# Patient Record
Sex: Female | Born: 1945 | Race: White | Hispanic: No | Marital: Married | Smoking: Former smoker
Health system: Southern US, Community
[De-identification: ages and names within clinical notes are randomized; demographics above are authoritative.]

## PROBLEM LIST (undated history)

## (undated) DIAGNOSIS — I10 Essential (primary) hypertension: Secondary | ICD-10-CM

## (undated) DIAGNOSIS — J302 Other seasonal allergic rhinitis: Secondary | ICD-10-CM

## (undated) DIAGNOSIS — Z853 Personal history of malignant neoplasm of breast: Secondary | ICD-10-CM

## (undated) HISTORY — PX: APPENDECTOMY: SHX54

## (undated) HISTORY — PX: TUBAL LIGATION: SHX77

## (undated) HISTORY — PX: BREAST SURGERY: SHX581

## (undated) HISTORY — PX: ABDOMINAL HYSTERECTOMY: SHX81

---

## 2007-05-25 ENCOUNTER — Ambulatory Visit: Payer: Self-pay | Admitting: Family Medicine

## 2007-09-03 ENCOUNTER — Ambulatory Visit: Payer: Self-pay | Admitting: Internal Medicine

## 2008-07-09 ENCOUNTER — Ambulatory Visit: Payer: Self-pay | Admitting: Internal Medicine

## 2014-10-06 ENCOUNTER — Ambulatory Visit: Payer: Self-pay | Admitting: Ophthalmology

## 2014-10-27 ENCOUNTER — Ambulatory Visit: Payer: Self-pay | Admitting: Ophthalmology

## 2019-08-10 ENCOUNTER — Encounter: Payer: Self-pay | Admitting: Emergency Medicine

## 2019-08-10 ENCOUNTER — Ambulatory Visit
Admission: EM | Admit: 2019-08-10 | Discharge: 2019-08-10 | Disposition: A | Discharge status: Home / Self Care | Payer: Medicare Other | Attending: Family Medicine | Admitting: Family Medicine

## 2019-08-10 ENCOUNTER — Ambulatory Visit (INDEPENDENT_AMBULATORY_CARE_PROVIDER_SITE_OTHER): Payer: Medicare Other

## 2019-08-10 ENCOUNTER — Other Ambulatory Visit: Payer: Self-pay

## 2019-08-10 DIAGNOSIS — S63616A Unspecified sprain of right little finger, initial encounter: Secondary | ICD-10-CM | POA: Diagnosis not present

## 2019-08-10 DIAGNOSIS — S60221A Contusion of right hand, initial encounter: Secondary | ICD-10-CM

## 2019-08-10 DIAGNOSIS — W01198A Fall on same level from slipping, tripping and stumbling with subsequent striking against other object, initial encounter: Secondary | ICD-10-CM | POA: Diagnosis not present

## 2019-08-10 DIAGNOSIS — M79641 Pain in right hand: Secondary | ICD-10-CM | POA: Diagnosis not present

## 2019-08-10 HISTORY — DX: Other seasonal allergic rhinitis: J30.2

## 2019-08-10 HISTORY — DX: Personal history of malignant neoplasm of breast: Z85.3

## 2019-08-10 HISTORY — DX: Essential (primary) hypertension: I10

## 2019-08-10 NOTE — ED Triage Notes (Signed)
Patient in today c/o right hand and pinky finger pain. Patient states she twisted her ankle this morning and tried to catch herself and hurt her right hand/finger.

## 2019-08-10 NOTE — Discharge Instructions (Addendum)
Rest, ice, elevation, over the counter analgesics  

## 2019-08-10 NOTE — ED Provider Notes (Signed)
[facility]    CSN: [id number] Arrival date & time: 08/10/19  1443      History   Chief Complaint Chief Complaint  Patient presents with  . Hand Injury    right DOI 08/10/19    HPI Melanie Wells is a 73 y.o. female.   73 yo female with a c/o right hand pain after injuring it today. Patient states she hit her hand as she was trying to catch herself as she fell. States pain is mainly over the 5th finger but her whole hand hurts.    Hand Injury   Past Medical History:  Diagnosis Date  . History of breast cancer   . Hypertension   . Seasonal allergies     There are no problems to display for this patient.   Past Surgical History:  Procedure Laterality Date  . ABDOMINAL HYSTERECTOMY    . APPENDECTOMY    . BREAST SURGERY    . TUBAL LIGATION      OB History   No obstetric history on file.      Home Medications    Prior to Admission medications   Medication Sig Start Date End Date Taking? Authorizing Provider  Cholecalciferol 25 MCG (1000 UT) tablet Take by mouth.   Yes [provider]  fexofenadine (ALLEGRA) 180 MG tablet Take 180 mg by mouth daily as needed for allergies or rhinitis.   Yes [provider]  fluticasone (FLONASE) 50 MCG/ACT nasal spray Place into the nose. 06/14/18  Yes [provider]  hydrochlorothiazide (HYDRODIURIL) 12.5 MG tablet Take 12.5 mg by mouth daily. 06/04/19  Yes [provider]  Omega-3 Fatty Acids (FISH OIL PO) Take by mouth.   Yes [provider]    Family History Family History  Problem Relation Age of Onset  . Other Mother        suicide  . Heart attack Father     Social History Social History   Tobacco Use  . Smoking status: Former Smoker    Quit date: 2007    Years since quitting: 13.9  . Smokeless tobacco: Never Used  Substance Use Topics  . Alcohol use: Yes    Comment: 8-10 drinks per week in the evening  . Drug use: Never     Allergies     Other   Review of Systems Review of Systems   Physical Exam Triage Vital Signs ED Triage Vitals  Enc Vitals Group     BP 08/10/19 1500 138/63     Pulse Rate 08/10/19 1500 95     Resp 08/10/19 1500 16     Temp 08/10/19 1500 98.1 F (36.7 C)     Temp Source 08/10/19 1500 Oral     SpO2 08/10/19 1500 99 %     Weight 08/10/19 1501 186 lb (84.4 kg)     Height 08/10/19 1501 5\' 3"  (1.6 m)     Head Circumference --      Peak Flow --      Pain Score 08/10/19 1501 9     Pain Loc --      Pain Edu? --      Excl. in [place]? --    No data found.  Updated Vital Signs BP 138/63 (BP Location: Left Arm)   Pulse 95   Temp 98.1 F (36.7 C) (Oral)   Resp 16   Ht 5\' 3"  (1.6 m)   Wt 84.4 kg   SpO2 99%   BMI 32.95 kg/m  Visual Acuity Right Eye Distance:   Left Eye Distance:   Bilateral Distance:    Right Eye Near:   Left Eye Near:    Bilateral Near:     Physical Exam Vitals and nursing note reviewed.  Constitutional:      General: She is not in acute distress.    Appearance: She is not ill-appearing or toxic-appearing.  Musculoskeletal:     Right hand: Swelling (5th finger) and bony tenderness (5th finger) present. No deformity, lacerations or tenderness. Normal range of motion. Normal strength. Normal sensation. There is no disruption of two-point discrimination. Normal capillary refill. Normal pulse.  Neurological:     Mental Status: She is alert.      UC Treatments / Results  Labs (all labs ordered are listed, but only abnormal results are displayed) Labs Reviewed - No data to display  EKG   Radiology DG Hand Complete Right  Result Date: 08/10/2019 CLINICAL DATA:  Fall.  Fifth metacarpal and small finger pain. EXAM: RIGHT HAND - COMPLETE 3+ VIEW COMPARISON:  None. FINDINGS: No acute fracture or dislocation. 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 Mild osteoarthritis of the thumb IP joint and third DIP joint. Osteopenia. Soft tissues are unremarkable.  IMPRESSION: No acute osseous abnormality. Electronically Signed   By: Titus Dubin M.D.   On: 08/10/2019 15:23    Procedures Procedures (including critical care time)  Medications Ordered in UC Medications - No data to display  Initial Impression / Assessment and Plan / UC Course  I have reviewed the triage vital signs and the nursing notes.  Pertinent labs & imaging results that were available during my care of the patient were reviewed by me and considered in my medical decision making (see chart for details).      Final Clinical Impressions(s) / UC Diagnoses   Final diagnoses:  Sprain of right little finger, unspecified site of digit, initial encounter  Contusion of right hand, initial encounter     Discharge Instructions     Rest, ice, elevation, over the counter analgesics    ED Prescriptions    None      1. x-ray results and diagnosis reviewed with patient 2. Recommend supportive treatment as above 3. Follow-up prn if symptoms worsen or don't improve  PDMP not reviewed this encounter.   Norval Gable, MD 08/10/19 [phone]

## 2019-10-09 ENCOUNTER — Other Ambulatory Visit (HOSPITAL_COMMUNITY): Payer: Self-pay | Admitting: Family Medicine

## 2019-10-09 ENCOUNTER — Other Ambulatory Visit: Payer: Self-pay | Admitting: Family Medicine

## 2019-10-09 ENCOUNTER — Other Ambulatory Visit: Payer: Self-pay

## 2019-10-09 ENCOUNTER — Ambulatory Visit
Admission: RE | Admit: 2019-10-09 | Discharge: 2019-10-09 | Disposition: A | Discharge status: Home / Self Care | Payer: Medicare Other | Source: Ambulatory Visit | Attending: Family Medicine | Admitting: Family Medicine

## 2019-10-09 DIAGNOSIS — R600 Localized edema: Secondary | ICD-10-CM | POA: Insufficient documentation

## 2020-10-20 IMAGING — US US EXTREM  UP VENOUS*L*
1 series · 13 of 24 positions shown · non-contrast
Comparison: None.

CLINICAL DATA: Left arm swelling and redness



[Series 1: us extrem up venous*left* · 0.08mm/px · 13 of 32 slices shown]
[im 1/32]
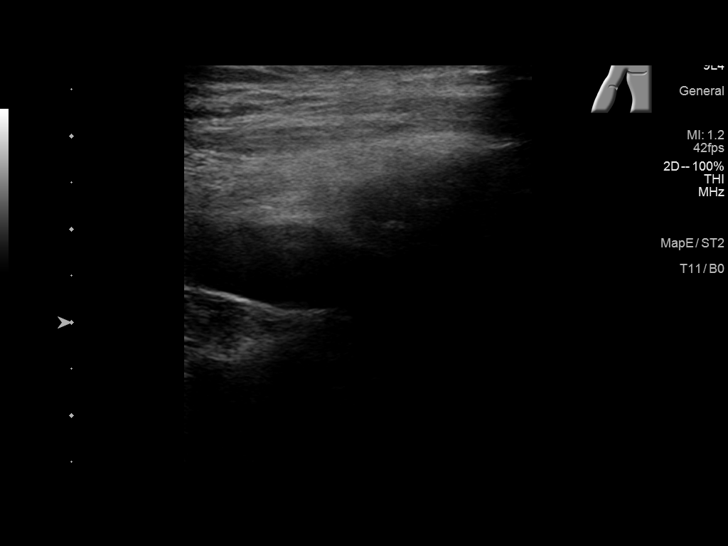
[im 3/32]
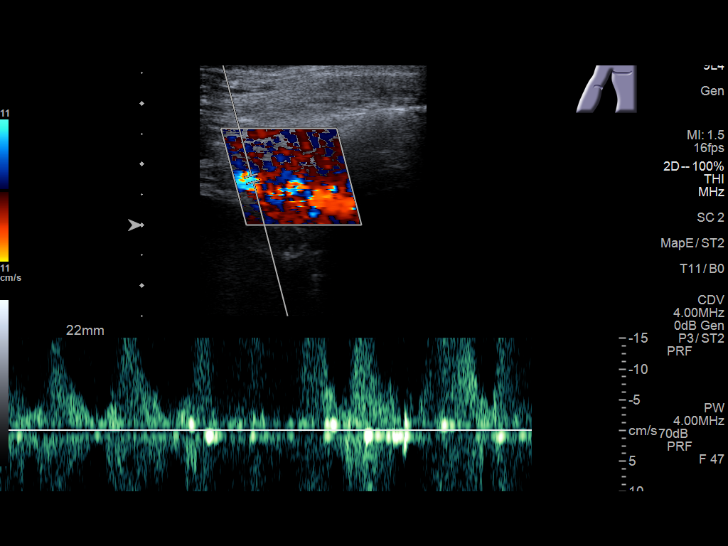
[im 6/32]
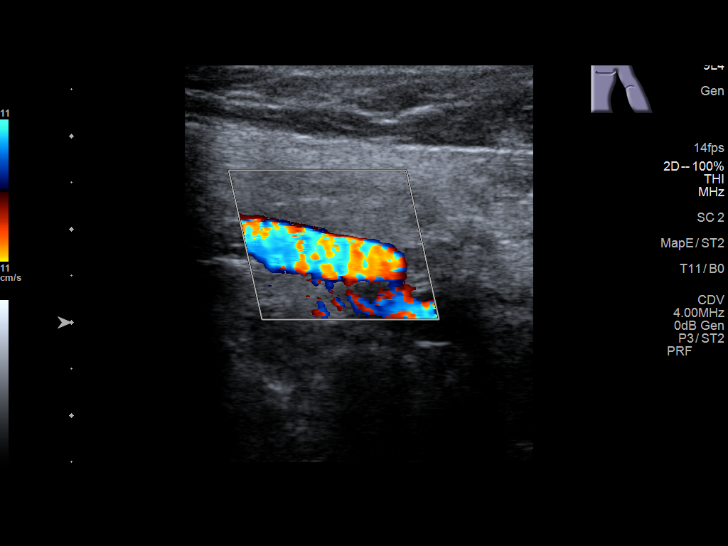
[im 9/32]
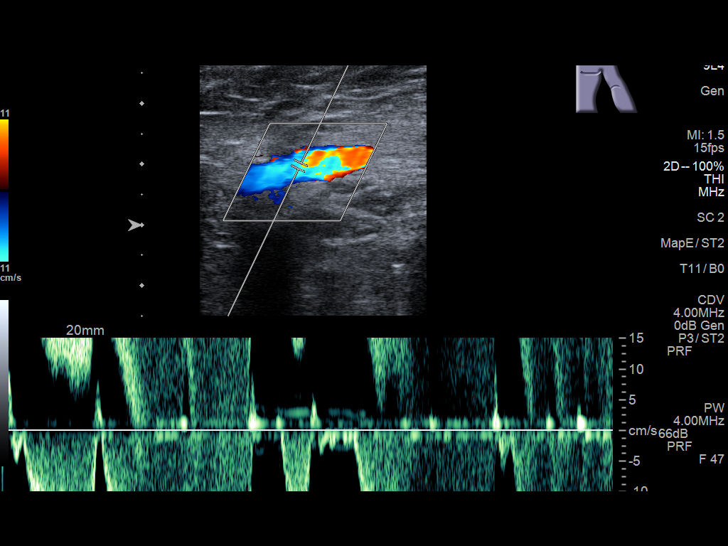
[im 11/32]
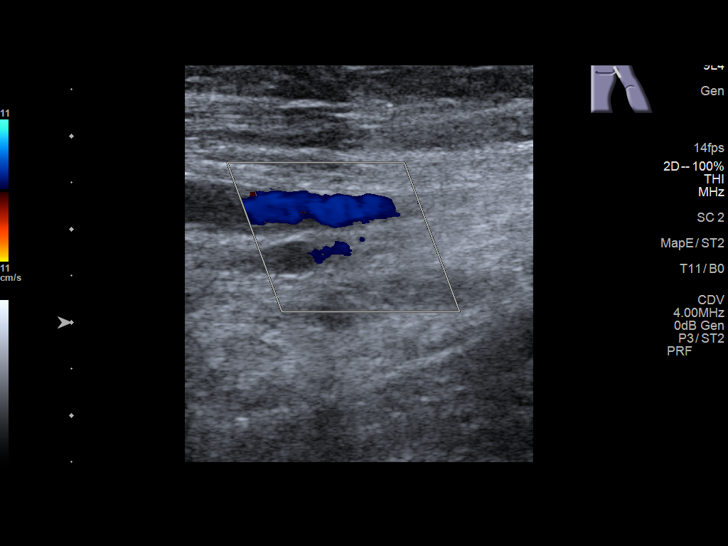
[im 14/32]
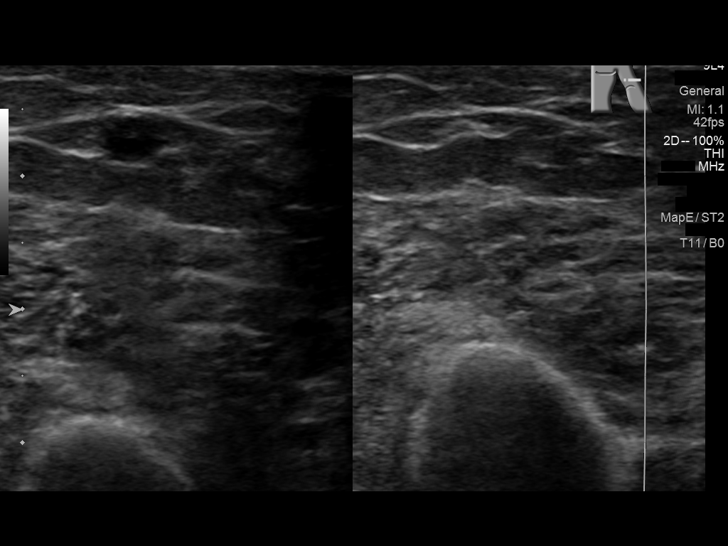
[im 17/32]
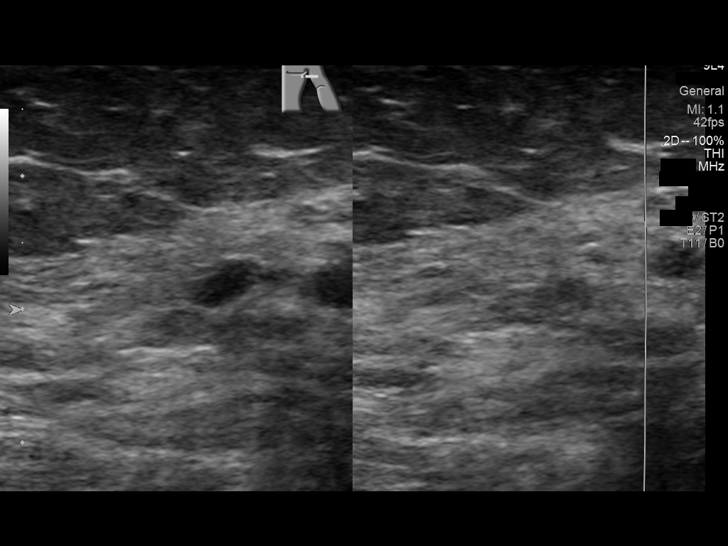
[im 18/32]
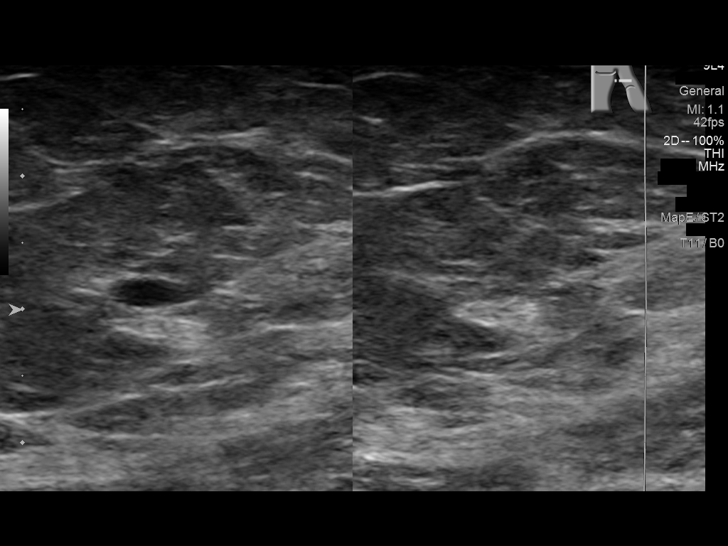
[im 21/32]
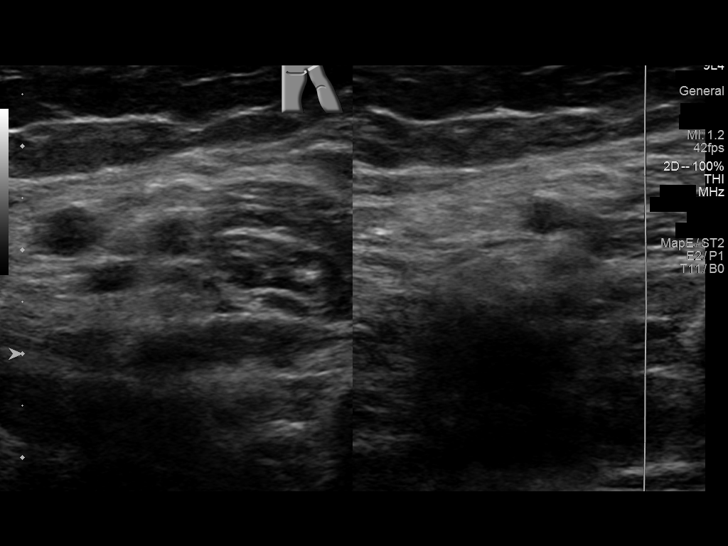
[im 23/32]
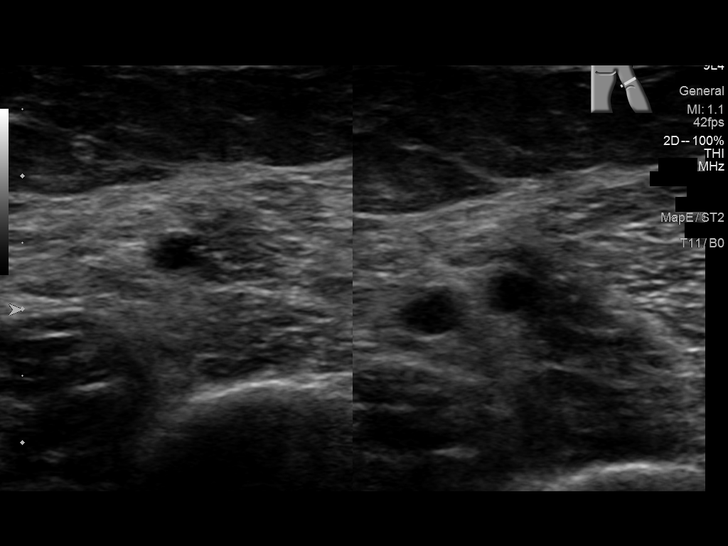
[im 26/32]
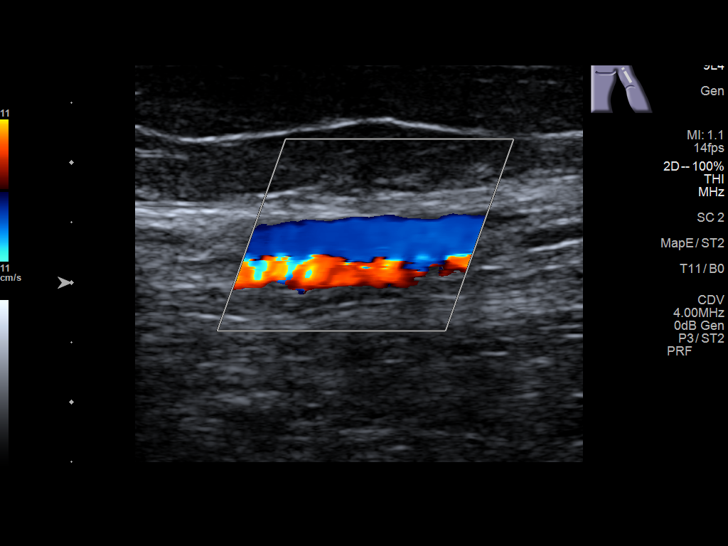
[im 29/32]
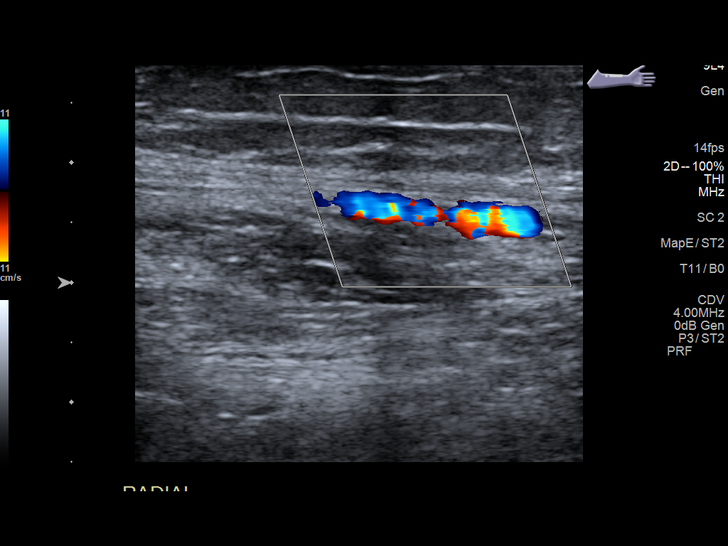
[im 32/32]
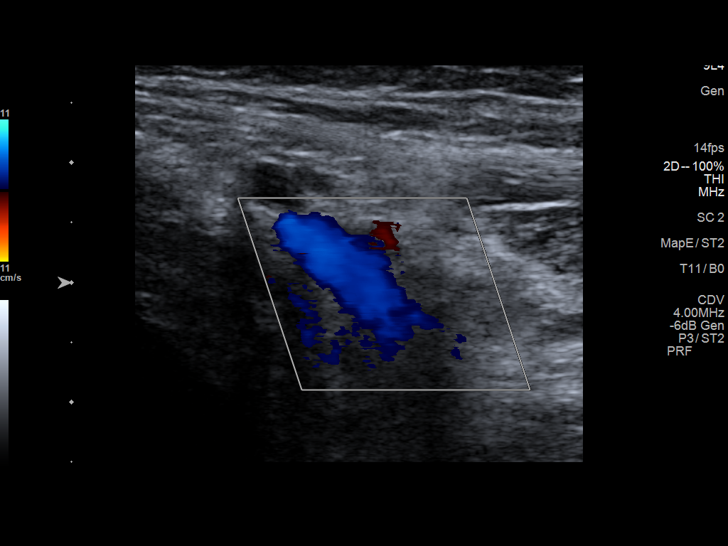

[13 of 24 positions shown; findings below may reference images not displayed]

FINDINGS: Contralateral Subclavian Vein: Respiratory phasicity is normal and
symmetric with the symptomatic side. No evidence of thrombus. Normal
compressibility.

Internal Jugular Vein: No evidence of thrombus. Normal
compressibility, respiratory phasicity and response to augmentation.

Subclavian Vein: No evidence of thrombus. Normal compressibility,
respiratory phasicity and response to augmentation.

Axillary Vein: No evidence of thrombus. Normal compressibility,
respiratory phasicity and response to augmentation.

Cephalic Vein: No evidence of thrombus. Normal compressibility,
respiratory phasicity and response to augmentation.

Basilic Vein: No evidence of thrombus. Normal compressibility,
respiratory phasicity and response to augmentation.

Brachial Veins: No evidence of thrombus. Normal compressibility,
respiratory phasicity and response to augmentation.

Radial Veins: No evidence of thrombus. Normal compressibility,
respiratory phasicity and response to augmentation.

Ulnar Veins: No evidence of thrombus. Normal compressibility,
respiratory phasicity and response to augmentation.

Venous Reflux:  None visualized.

Other Findings:  None visualized.
IMPRESSION: No evidence of DVT within the left upper extremity.

## 2022-09-26 ENCOUNTER — Other Ambulatory Visit: Payer: Self-pay | Admitting: Internal Medicine

## 2022-09-26 DIAGNOSIS — S0990XA Unspecified injury of head, initial encounter: Secondary | ICD-10-CM

## 2022-09-27 ENCOUNTER — Other Ambulatory Visit: Payer: Self-pay | Admitting: Internal Medicine

## 2022-09-27 ENCOUNTER — Ambulatory Visit
Admission: RE | Admit: 2022-09-27 | Discharge: 2022-09-27 | Disposition: A | Discharge status: Home / Self Care | Payer: Medicare Other | Source: Home / Self Care | Attending: Internal Medicine | Admitting: Internal Medicine

## 2022-09-27 ENCOUNTER — Ambulatory Visit
Admission: RE | Admit: 2022-09-27 | Discharge: 2022-09-27 | Disposition: A | Discharge status: Home / Self Care | Payer: Medicare Other | Source: Ambulatory Visit | Attending: Internal Medicine | Admitting: Internal Medicine

## 2022-09-27 DIAGNOSIS — S0990XA Unspecified injury of head, initial encounter: Secondary | ICD-10-CM | POA: Diagnosis present

## 2022-09-27 DIAGNOSIS — R0781 Pleurodynia: Secondary | ICD-10-CM

## 2022-10-14 ENCOUNTER — Ambulatory Visit
Admission: EM | Admit: 2022-10-14 | Discharge: 2022-10-14 | Disposition: A | Discharge status: Home / Self Care | Payer: Medicare Other

## 2022-10-14 ENCOUNTER — Emergency Department: Payer: Medicare Other

## 2022-10-14 ENCOUNTER — Emergency Department
Admission: EM | Admit: 2022-10-14 | Discharge: 2022-10-14 | Disposition: A | Discharge status: Home / Self Care | Payer: Medicare Other | Attending: Emergency Medicine | Admitting: Emergency Medicine

## 2022-10-14 DIAGNOSIS — I10 Essential (primary) hypertension: Secondary | ICD-10-CM | POA: Diagnosis not present

## 2022-10-14 DIAGNOSIS — W010XXA Fall on same level from slipping, tripping and stumbling without subsequent striking against object, initial encounter: Secondary | ICD-10-CM | POA: Insufficient documentation

## 2022-10-14 DIAGNOSIS — S0990XA Unspecified injury of head, initial encounter: Secondary | ICD-10-CM | POA: Insufficient documentation

## 2022-10-14 NOTE — ED Triage Notes (Signed)
Pt presents to the ED via POV due to a fall yesterday. Pt states she tripped over a rock yesterday and fell. Pt states she hit her head. Denies blood thinner and LOC. Pt A&Ox4

## 2022-10-14 NOTE — Discharge Instructions (Signed)
-  Your head CT did not show any findings suggestive of any traumatic injury.  However, it is possible that you sustained a concussion.  Your symptoms should improve over the next few weeks.  Please review the educational material provided.  You may take Tylenol and/or ibuprofen as needed.  -The CT scan of your cervical spine did not show any signs of fractures, however did show some sclerotic lesions along your scapula.  Please follow-up with your primary care provider to schedule a nuclear bone medicine scan as needed.  -Your blood pressure was found to be quite high today.  Please follow-up with your primary care provider in regards to ongoing management of this.  -Return to the emergency department anytime if you begin to experience any new or worsening symptoms.

## 2022-10-14 NOTE — ED Provider Notes (Signed)
[facility] Provider Note    Event Date/Time   First MD Initiated Contact with Patient 10/14/22 1542     (approximate)   History   Chief Complaint Fall   HPI Melanie Wells is a 77 y.o. female, history of hypertension, seasonal allergies, presents to the emergency department for evaluation of head injury.  She states that yesterday she accidentally put her foot into a pothole, causing her to fall down and hit the right side of her head.  Denies any loss of consciousness or additional injuries.  She states that she felt fine at first, however this morning, she was noticing she was having a headache, particularly on the left side.  Denies any blood thinners.  Denies fever/chills, chest pain, shortness of breath, nausea/vomit, diarrhea, urinary symptoms, vision changes, weakness, hearing changes, paresthesias, or dizziness/lightheadedness.  History Limitations: No limitations.        Physical Exam  Triage Vital Signs: ED Triage Vitals  Enc Vitals Group     BP 10/14/22 1523 (!) 190/78     Pulse Rate 10/14/22 1523 82     Resp 10/14/22 1523 18     Temp 10/14/22 1523 98.2 F (36.8 C)     Temp Source 10/14/22 1523 Oral     SpO2 10/14/22 1523 97 %     Weight 10/14/22 1521 186 lb 1.1 oz (84.4 kg)     Height 10/14/22 1521 '5\' 3"'$  (1.6 m)     Head Circumference --      Peak Flow --      Pain Score 10/14/22 1520 6     Pain Loc --      Pain Edu? --      Excl. in [place]? --     Most recent vital signs: Vitals:   10/14/22 1523  BP: (!) 190/78  Pulse: 82  Resp: 18  Temp: 98.2 F (36.8 C)  SpO2: 97%    General: Awake, NAD.  Skin: Warm, dry. No rashes or lesions.  Eyes: PERRL. Conjunctivae normal.  CV: Good peripheral perfusion.  Resp: Normal effort.  Abd: Soft, non-tender. No distention.  Neuro: At baseline. No gross neurological deficits.  Musculoskeletal: Normal ROM of all extremities.  Focused Exam: Head is atraumatic.  No obvious deformities or  crepitus.  No cervical spine tenderness.  Normal range of motion of the head/neck.  No gross neurological deficits.  Normal strength and sensation in both upper and lower extremities.  She is able to ambulate on her own without assistance.  No ataxia.  Physical Exam    ED Results / Procedures / Treatments  Labs (all labs ordered are listed, but only abnormal results are displayed) Labs Reviewed - No data to display   EKG N/A.    RADIOLOGY  ED Provider Interpretation: I personally reviewed and interpreted these images, head CT shows no acute intracranial abnormality.  CT cervical spine shows no acute fracture.  CT Cervical Spine Wo Contrast  Result Date: 10/14/2022 CLINICAL DATA:  Status post fall. EXAM: CT CERVICAL SPINE WITHOUT CONTRAST TECHNIQUE: Multidetector CT imaging of the cervical spine was performed without intravenous contrast. Multiplanar CT image reconstructions were also generated. RADIATION DOSE REDUCTION: This exam was performed according to the departmental dose-optimization program which includes automated exposure control, adjustment of the mA and/or kV according to patient size and/or use of iterative reconstruction technique. COMPARISON:  None Available. FINDINGS: Alignment: Approximately 1 mm anterolisthesis of the C4 vertebral body is seen on C5. 2 mm retrolisthesis of  the C6 vertebral body is noted on C7. Skull base and vertebrae: No acute cervical spine fracture. No primary bone lesion or focal pathologic process. Soft tissues and spinal canal: No prevertebral fluid or swelling. No visible canal hematoma. Disc levels: Marked severity endplate sclerosis, anterior osteophyte formation and posterior bony spurring are seen at the levels of C5-C6 and C6-C7. Moderate severity posterior bony spurring is also seen at the level of C4-C5. There is marked severity narrowing of the anterior atlantoaxial articulation. Marked severity intervertebral disc space narrowing is also seen  at C5-C6 and C6-C7. Mild i to moderate ntervertebral disc space narrowing is noted at C2-C3. Bilateral marked severity multilevel facet joint hypertrophy is noted, left greater than right. Upper chest: There is mild posterior biapical scarring and/or linear atelectasis. Other: Multiple chronic left-sided rib fractures are noted. Diffuse mixed lytic and sclerotic changes are seen throughout the visualized portion of the left scapula. IMPRESSION: 1. No acute cervical spine fracture or traumatic subluxation. 2. Marked severity multilevel degenerative changes, as described above. 3. Diffuse mixed lytic and sclerotic changes throughout the visualized portion of the left scapula. While this may represent sequelae associated with Paget's disease, osseous metastasis cannot be excluded. Correlation with a nuclear medicine bone scan is recommended. 4. Multiple chronic left-sided rib fractures. 5. Mild posterior biapical scarring and/or linear atelectasis. Electronically Signed   By: Virgina Norfolk M.D.   On: 10/14/2022 17:42   CT Head Wo Contrast  Result Date: 10/14/2022 CLINICAL DATA:  Status post fall. EXAM: CT HEAD WITHOUT CONTRAST TECHNIQUE: Contiguous axial images were obtained from the base of the skull through the vertex without intravenous contrast. RADIATION DOSE REDUCTION: This exam was performed according to the departmental dose-optimization program which includes automated exposure control, adjustment of the mA and/or kV according to patient size and/or use of iterative reconstruction technique. COMPARISON:  None Available. FINDINGS: Brain: There is mild cerebral atrophy with widening of the extra-axial spaces and ventricular dilatation. There are areas of decreased attenuation within the white matter tracts of the supratentorial brain, consistent with microvascular disease changes. Vascular: There is marked severity calcification of the bilateral cavernous carotid arteries. Skull: Normal. Negative for  fracture or focal lesion. Sinuses/Orbits: Marked severity sphenoid sinus mucosal thickening versus retention cyst is seen. Other: None. IMPRESSION: 1. No acute intracranial abnormality. 2. Generalized cerebral atrophy with widening of the extra-axial spaces and ventricular dilatation. 3. Marked severity sphenoid sinus disease. Electronically Signed   By: Virgina Norfolk M.D.   On: 10/14/2022 17:32    PROCEDURES:  Critical Care performed: N/A.  Procedures    MEDICATIONS ORDERED IN ED: Medications - No data to display   IMPRESSION / MDM / [place] / ED COURSE  I reviewed the triage vital signs and the nursing notes.                              Differential diagnosis includes, but is not limited to, concussion, postconcussive syndrome, epidural/subdural hematoma, skull fracture, cervical spine fracture, cervical sprain.  Assessment/Plan Patient with headache following mechanical fall that occurred yesterday.  She appears well clinically.  No signs of any major trauma.  No neurological deficits at this time.  CT scans are reassuring, no evidence of acute intracranial hemorrhage or cervical spine fracture.  Notified her of the incidental findings, including the mixed lytic and sclerotic changes along her left scapula.  Advised her that she will need to follow-up with her  primary care provider to schedule a nuclear medicine bone scan.  However, overall suspect that she is likely experiencing symptoms from postconcussive syndrome.  She does appear to be hypertensive today at 190/78, though she is asymptomatic.  No signs suggest any endorgan dysfunction.  She states that her blood pressure was normal this morning.  She has not missed any doses of her blood pressure medications.  Encouraged her to follow-up with her primary care provider for ongoing evaluation and management.  Considered admission for this patient, but given her stable presentation and unremarkable findings, she is  unlikely benefit from admission.  Provided the patient with anticipatory guidance, return precautions, and educational material. Encouraged the patient to return to the emergency department at any time if they begin to experience any new or worsening symptoms. Patient expressed understanding and agreed with the plan.   Patient's presentation is most consistent with acute complicated illness / injury requiring diagnostic workup.       FINAL CLINICAL IMPRESSION(S) / ED DIAGNOSES   Final diagnoses:  Injury of head, initial encounter     Rx / DC Orders   ED Discharge Orders     None        Note:  This document was prepared using Dragon voice recognition software and may include unintentional dictation errors.   Teodoro Spray, [place] 10/14/22 1759    Nance Pear, MD 10/14/22 [phone]

## 2023-01-13 ENCOUNTER — Other Ambulatory Visit: Payer: Self-pay | Admitting: Student

## 2023-01-13 DIAGNOSIS — H93A1 Pulsatile tinnitus, right ear: Secondary | ICD-10-CM

## 2023-01-23 ENCOUNTER — Other Ambulatory Visit: Payer: Self-pay | Admitting: Student

## 2023-01-23 DIAGNOSIS — H93A1 Pulsatile tinnitus, right ear: Secondary | ICD-10-CM
